# Patient Record
Sex: Female | Born: 2005 | Race: White | Hispanic: No | Marital: Single | Smoking: Never smoker
Health system: Southern US, Community
[De-identification: ages and names within clinical notes are randomized; demographics above are authoritative.]

## PROBLEM LIST (undated history)

## (undated) DIAGNOSIS — F419 Anxiety disorder, unspecified: Secondary | ICD-10-CM

## (undated) HISTORY — PX: TONSILLECTOMY AND ADENOIDECTOMY: SUR1326

## (undated) HISTORY — DX: Anxiety disorder, unspecified: F41.9

---
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
## 2023-11-04 ENCOUNTER — Ambulatory Visit: Admitting: Family Medicine
